# Patient Record
Sex: Female | Born: 1965 | Race: White | Hispanic: No | Marital: Single | Smoking: Former smoker
Health system: Southern US, Community
[De-identification: ages and names within clinical notes are randomized; demographics above are authoritative.]

## PROBLEM LIST (undated history)

## (undated) DIAGNOSIS — L409 Psoriasis, unspecified: Secondary | ICD-10-CM

---

## 1998-08-20 ENCOUNTER — Observation Stay (HOSPITAL_COMMUNITY): Admission: RE | Admit: 1998-08-20 | Discharge: 1998-08-21 | Payer: Self-pay | Admitting: Orthopedic Surgery

## 2000-04-12 ENCOUNTER — Encounter: Payer: Self-pay | Admitting: Neurosurgery

## 2000-04-12 ENCOUNTER — Ambulatory Visit (HOSPITAL_COMMUNITY): Admission: RE | Admit: 2000-04-12 | Discharge: 2000-04-12 | Payer: Self-pay | Admitting: Neurosurgery

## 2000-04-13 ENCOUNTER — Ambulatory Visit (HOSPITAL_COMMUNITY): Admission: RE | Admit: 2000-04-13 | Discharge: 2000-04-13 | Payer: Self-pay | Admitting: Neurosurgery

## 2000-04-13 ENCOUNTER — Encounter: Payer: Self-pay | Admitting: Neurosurgery

## 2003-01-28 ENCOUNTER — Emergency Department (HOSPITAL_COMMUNITY): Admission: EM | Admit: 2003-01-28 | Discharge: 2003-01-28 | Payer: Self-pay | Admitting: Emergency Medicine

## 2003-06-30 ENCOUNTER — Encounter: Payer: Self-pay | Admitting: Dermatology

## 2003-06-30 ENCOUNTER — Ambulatory Visit (HOSPITAL_COMMUNITY): Admission: RE | Admit: 2003-06-30 | Discharge: 2003-06-30 | Payer: Self-pay | Admitting: Dermatology

## 2014-01-07 ENCOUNTER — Emergency Department (HOSPITAL_COMMUNITY)
Admission: EM | Admit: 2014-01-07 | Discharge: 2014-01-07 | Disposition: A | Discharge status: Home / Self Care | Payer: 59 | Source: Home / Self Care | Attending: Family Medicine | Admitting: Family Medicine

## 2014-01-07 ENCOUNTER — Encounter (HOSPITAL_COMMUNITY): Payer: Self-pay | Admitting: Emergency Medicine

## 2014-01-07 DIAGNOSIS — J40 Bronchitis, not specified as acute or chronic: Secondary | ICD-10-CM

## 2014-01-07 HISTORY — DX: Psoriasis, unspecified: L40.9

## 2014-01-07 MED ORDER — IPRATROPIUM-ALBUTEROL 0.5-2.5 (3) MG/3ML IN SOLN: 3.0000 mL | RESPIRATORY_TRACT | Status: DC

## 2014-01-07 MED ORDER — IPRATROPIUM-ALBUTEROL 0.5-2.5 (3) MG/3ML IN SOLN
RESPIRATORY_TRACT | Status: AC
  Filled 2014-01-07: qty 3

## 2014-01-07 MED ORDER — ALBUTEROL SULFATE HFA 108 (90 BASE) MCG/ACT IN AERS: 2.0000 | INHALATION_SPRAY | Freq: Four times a day (QID) | RESPIRATORY_TRACT | Status: AC | PRN

## 2014-01-07 MED ORDER — PREDNISONE 50 MG PO TABS: 50.0000 mg | ORAL_TABLET | Freq: Every day | ORAL | Status: AC

## 2014-01-07 MED ORDER — GUAIFENESIN-CODEINE 100-10 MG/5ML PO SOLN: 5.0000 mL | Freq: Every evening | ORAL | Status: AC | PRN

## 2014-01-07 MED ORDER — IPRATROPIUM-ALBUTEROL 0.5-2.5 (3) MG/3ML IN SOLN
3.0000 mL | Freq: Once | RESPIRATORY_TRACT | Status: AC
  Administered 2014-01-07: 3 mL via RESPIRATORY_TRACT

## 2014-01-07 NOTE — ED Notes (Signed)
C/o productive cough States sx started almost a week ago States sx was a sore throat, dry cough, and stuffy nose States now she has a productive cough and runny nose dayquil has been used as tx

## 2014-01-07 NOTE — Discharge Instructions (Signed)
Thank you for coming in today. Take prednisone daily for 5 days. Use albuterol as needed. Use codeine containing cough medication at bedtime as needed Call or go to the emergency room if you get worse, have trouble breathing, have chest pains, or palpitations.   Bronchitis Bronchitis is inflammation of the airways that extend from the windpipe into the lungs (bronchi). The inflammation often causes mucus to develop, which leads to a cough. If the inflammation becomes severe, it may cause shortness of breath. CAUSES  Bronchitis may be caused by:   Viral infections.   Bacteria.   Cigarette smoke.   Allergens, pollutants, and other irritants.  SIGNS AND SYMPTOMS  The most common symptom of bronchitis is a frequent cough that produces mucus. Other symptoms include:  Fever.   Body aches.   Chest congestion.   Chills.   Shortness of breath.   Sore throat.  DIAGNOSIS  Bronchitis is usually diagnosed through a medical history and physical exam. Tests, such as chest X-rays, are sometimes done to rule out other conditions.  TREATMENT  You may need to avoid contact with whatever caused the problem (smoking, for example). Medicines are sometimes needed. These may include:  Antibiotics. These may be prescribed if the condition is caused by bacteria.  Cough suppressants. These may be prescribed for relief of cough symptoms.   Inhaled medicines. These may be prescribed to help open your airways and make it easier for you to breathe.   Steroid medicines. These may be prescribed for those with recurrent (chronic) bronchitis. HOME CARE INSTRUCTIONS  Get plenty of rest.   Drink enough fluids to keep your urine clear or pale yellow (unless you have a medical condition that requires fluid restriction). Increasing fluids may help thin your secretions and will prevent dehydration.   Only take over-the-counter or prescription medicines as directed by your health care  provider.  Only take antibiotics as directed. Make sure you finish them even if you start to feel better.  Avoid secondhand smoke, irritating chemicals, and strong fumes. These will make bronchitis worse. If you are a smoker, quit smoking. Consider using nicotine gum or skin patches to help control withdrawal symptoms. Quitting smoking will help your lungs heal faster.   Put a cool-mist humidifier in your bedroom at night to moisten the air. This may help loosen mucus. Change the water in the humidifier daily. You can also run the hot water in your shower and sit in the bathroom with the door closed for 5 10 minutes.   Follow up with your health care provider as directed.   Wash your hands frequently to avoid catching bronchitis again or spreading an infection to others.  SEEK MEDICAL CARE IF: Your symptoms do not improve after 1 week of treatment.  SEEK IMMEDIATE MEDICAL CARE IF:  Your fever increases.  You have chills.   You have chest pain.   You have worsening shortness of breath.   You have bloody sputum.  You faint.  You have lightheadedness.  You have a severe headache.   You vomit repeatedly. MAKE SURE YOU:   Understand these instructions.  Will watch your condition.  Will get help right away if you are not doing well or get worse. Document Released: 11/27/2005 Document Revised: 09/17/2013 Document Reviewed: 07/22/2013 [facility] Patient Information 2014 [place], [place].

## 2014-01-07 NOTE — ED Provider Notes (Signed)
Tina BerryMargaret A Roman is a 48 y.o. female who presents to Urgent Care today for 6 days of productive cough mild shortness of breath and chills. No nausea vomiting diarrhea or fever. Patient has tried DayQuil which seems to help. She denies any chest pains or palpitations. The cough is quite bothersome especially at bedtime.   Past Medical History  Diagnosis Date  . Psoriasis    History  Substance Use Topics  . Smoking status: Former Games developermoker  . Smokeless tobacco: Not on file  . Alcohol Use: Not on file   ROS as above Medications: No current facility-administered medications for this encounter.   Current Outpatient Prescriptions  Medication Sig Dispense Refill  . albuterol (PROVENTIL HFA;VENTOLIN HFA) 108 (90 BASE) MCG/ACT inhaler Inhale 2 puffs into the lungs every 6 (six) hours as needed for wheezing or shortness of breath.  1 Inhaler  2  . guaiFENesin-codeine 100-10 MG/5ML syrup Take 5 mLs by mouth at bedtime as needed for cough.  120 mL  0  . predniSONE (DELTASONE) 50 MG tablet Take 1 tablet (50 mg total) by mouth daily.  5 tablet  0   patient uses Enbrel intermittently for psoriasis  Exam:  BP 125/84  Pulse 99  Temp(Src) 98.4 F (36.9 C) (Oral)  Resp 18  SpO2 98%  LMP 01/05/2014 Gen: Well NAD HEENT: EOMI,  MMM posterior pharynx with cobblestoning. Tympanic membranes are normal appearing bilaterally Lungs: Normal work of breathing. Coarse breath sounds bilaterally. Heart: RRR no MRG Abd: NABS, Soft. NT, ND Exts: Brisk capillary refill, warm and well perfused.   Patient was given a DuoNeb nebulizer treatment and had significant improvement in symptoms.  Assessment and Plan: 48 y.o. female with bronchitis likely due to viral cause with possible underlying COPD. Plan to treat with prednisone, albuterol, codeine containing cough medication. Work note provided.  Discussed warning signs or symptoms. Please see discharge instructions. Patient expresses understanding.    Rodolph BongEvan S  Zerah Hilyer, MD 01/07/14 [phone]

## 2018-11-19 ENCOUNTER — Other Ambulatory Visit: Payer: Self-pay | Admitting: Physician Assistant

## 2018-11-19 DIAGNOSIS — R7989 Other specified abnormal findings of blood chemistry: Secondary | ICD-10-CM

## 2018-11-19 DIAGNOSIS — R945 Abnormal results of liver function studies: Secondary | ICD-10-CM

## 2018-11-26 ENCOUNTER — Ambulatory Visit
Admission: RE | Admit: 2018-11-26 | Discharge: 2018-11-26 | Disposition: A | Discharge status: Home / Self Care | Payer: 59 | Source: Ambulatory Visit | Attending: Physician Assistant | Admitting: Physician Assistant

## 2018-11-26 DIAGNOSIS — R945 Abnormal results of liver function studies: Secondary | ICD-10-CM

## 2018-11-26 DIAGNOSIS — R7989 Other specified abnormal findings of blood chemistry: Secondary | ICD-10-CM

## 2020-02-07 ENCOUNTER — Ambulatory Visit: Payer: BC Managed Care – PPO | Attending: Internal Medicine

## 2020-02-07 DIAGNOSIS — Z23 Encounter for immunization: Secondary | ICD-10-CM | POA: Insufficient documentation

## 2020-02-07 NOTE — Progress Notes (Signed)
   Covid-19 Vaccination Clinic  Name:  Tina Roman    MRN: [id number] DOB: 07-04-1966  02/07/2020  Tina Roman was observed post Covid-19 immunization for 15 minutes without incidence. She was provided with Vaccine Information Sheet and instruction to access the V-Safe system.   Tina Roman was instructed to call 911 with any severe reactions post vaccine: Marland Kitchen Difficulty breathing  . Swelling of your face and throat  . A fast heartbeat  . A bad rash all over your body  . Dizziness and weakness    Immunizations Administered    Name Date Dose VIS Date Route   Pfizer COVID-19 Vaccine 02/07/2020 12:31 PM 0.3 mL 11/21/2019 Intramuscular   Manufacturer: [employer], [employer]   Lot: [id number]   NDC: [address]-4

## 2020-02-28 ENCOUNTER — Ambulatory Visit: Payer: BC Managed Care – PPO | Attending: Internal Medicine

## 2020-02-28 DIAGNOSIS — Z23 Encounter for immunization: Secondary | ICD-10-CM

## 2020-02-28 NOTE — Progress Notes (Signed)
   Covid-19 Vaccination Clinic  Name:  Shayma Pfefferle Diveley    MRN: [id number] DOB: Nov 05, 1966  02/28/2020  Ms. Kadrmas was observed post Covid-19 immunization for 15 minutes without incident. She was provided with Vaccine Information Sheet and instruction to access the V-Safe system.   Ms. Prindle was instructed to call 911 with any severe reactions post vaccine: Marland Kitchen Difficulty breathing  . Swelling of face and throat  . A fast heartbeat  . A bad rash all over body  . Dizziness and weakness   Immunizations Administered    Name Date Dose VIS Date Route   Pfizer COVID-19 Vaccine 02/28/2020 11:36 AM 0.3 mL 11/21/2019 Intramuscular   Manufacturer: [employer], [employer]   Lot: [id number]   NDC: [address]-5

## 2020-03-03 ENCOUNTER — Ambulatory Visit: Payer: BC Managed Care – PPO

## 2021-07-27 ENCOUNTER — Other Ambulatory Visit: Payer: Self-pay | Admitting: Physician Assistant

## 2021-07-27 DIAGNOSIS — M5442 Lumbago with sciatica, left side: Secondary | ICD-10-CM

## 2021-08-08 ENCOUNTER — Other Ambulatory Visit: Payer: Self-pay | Admitting: Physician Assistant

## 2021-08-08 DIAGNOSIS — Z1231 Encounter for screening mammogram for malignant neoplasm of breast: Secondary | ICD-10-CM

## 2021-09-19 ENCOUNTER — Ambulatory Visit
Admission: RE | Admit: 2021-09-19 | Discharge: 2021-09-19 | Disposition: A | Discharge status: Home / Self Care | Payer: BC Managed Care – PPO | Source: Ambulatory Visit | Attending: Physician Assistant | Admitting: Physician Assistant

## 2021-09-19 ENCOUNTER — Other Ambulatory Visit: Payer: Self-pay

## 2021-09-19 DIAGNOSIS — Z1231 Encounter for screening mammogram for malignant neoplasm of breast: Secondary | ICD-10-CM

## 2021-09-22 ENCOUNTER — Other Ambulatory Visit: Payer: Self-pay | Admitting: Physician Assistant

## 2021-09-22 DIAGNOSIS — R928 Other abnormal and inconclusive findings on diagnostic imaging of breast: Secondary | ICD-10-CM

## 2021-10-14 ENCOUNTER — Other Ambulatory Visit: Payer: Self-pay

## 2021-10-14 ENCOUNTER — Ambulatory Visit
Admission: RE | Admit: 2021-10-14 | Discharge: 2021-10-14 | Disposition: A | Discharge status: Home / Self Care | Payer: BC Managed Care – PPO | Source: Ambulatory Visit | Attending: Physician Assistant | Admitting: Physician Assistant

## 2021-10-14 ENCOUNTER — Other Ambulatory Visit: Payer: Self-pay | Admitting: Physician Assistant

## 2021-10-14 DIAGNOSIS — R599 Enlarged lymph nodes, unspecified: Secondary | ICD-10-CM

## 2021-10-14 DIAGNOSIS — R928 Other abnormal and inconclusive findings on diagnostic imaging of breast: Secondary | ICD-10-CM

## 2022-01-16 ENCOUNTER — Ambulatory Visit
Admission: RE | Admit: 2022-01-16 | Discharge: 2022-01-16 | Disposition: A | Discharge status: Home / Self Care | Payer: BC Managed Care – PPO | Source: Ambulatory Visit | Attending: Physician Assistant | Admitting: Physician Assistant

## 2022-01-16 ENCOUNTER — Other Ambulatory Visit: Payer: Self-pay

## 2022-01-16 DIAGNOSIS — R599 Enlarged lymph nodes, unspecified: Secondary | ICD-10-CM

## 2022-03-19 IMAGING — MG MM DIGITAL SCREENING BILAT W/ TOMO AND CAD
8 series · 8 of 24 positions shown · non-contrast
Comparison: None.

CLINICAL DATA: Screening.

EXAM:
DIGITAL SCREENING BILATERAL MAMMOGRAM WITH TOMOSYNTHESIS AND CAD
TECHNIQUE: Bilateral screening digital craniocaudal and mediolateral oblique
mammograms were obtained. Bilateral screening digital breast
tomosynthesis was performed. The images were evaluated with
computer-aided detection.

[L MLO synth-2D]
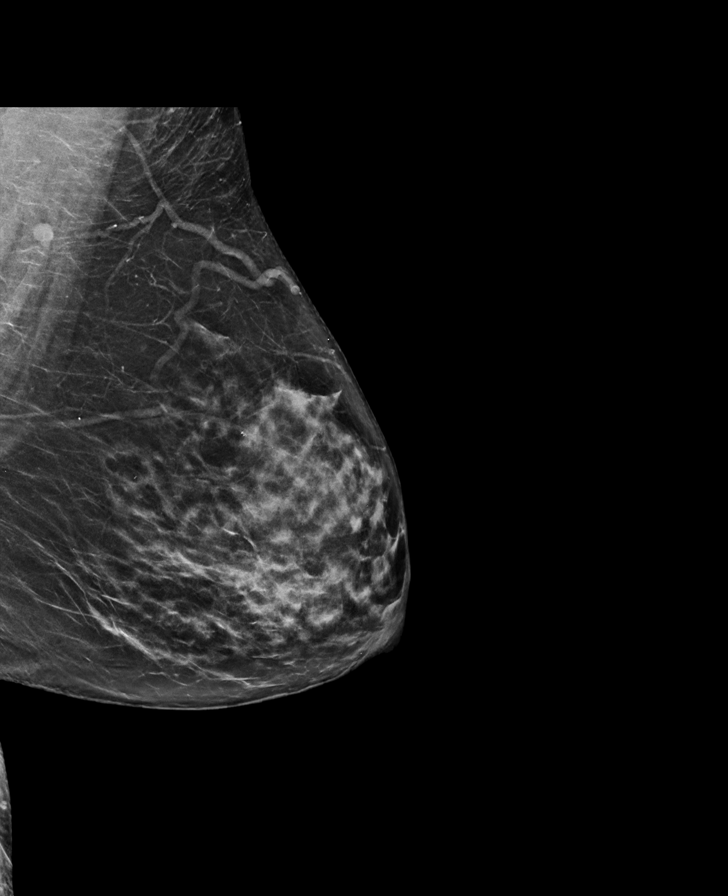

[R MLO synth-2D]
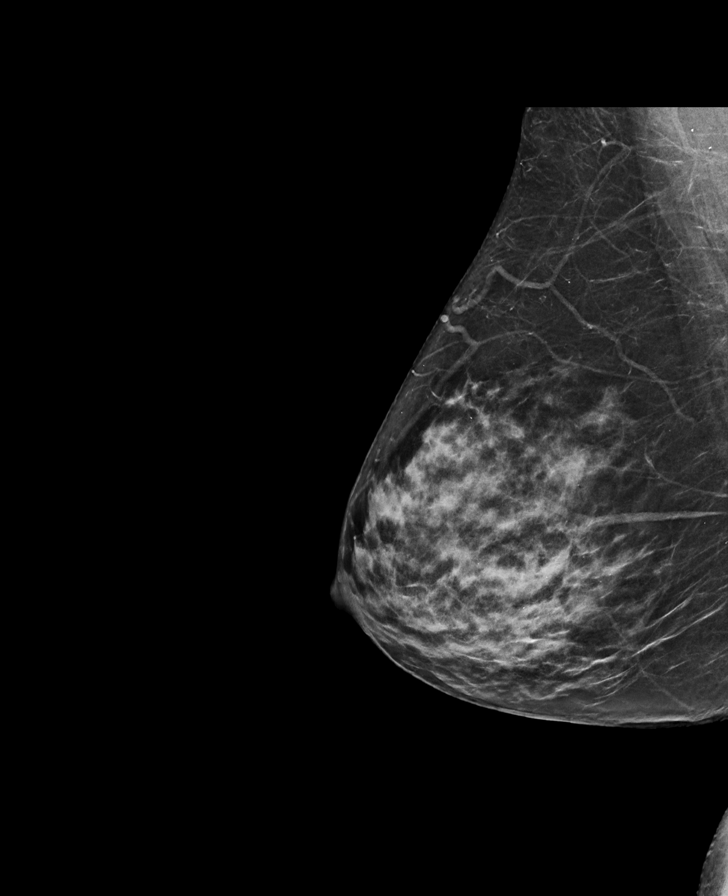

[R CC synth-2D]
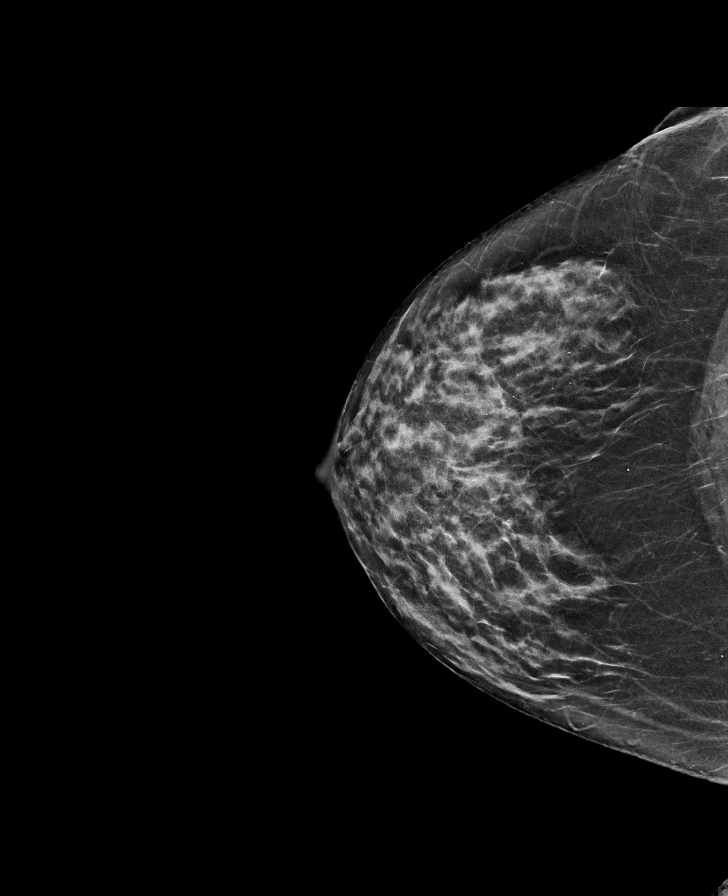

[L CC synth-2D]
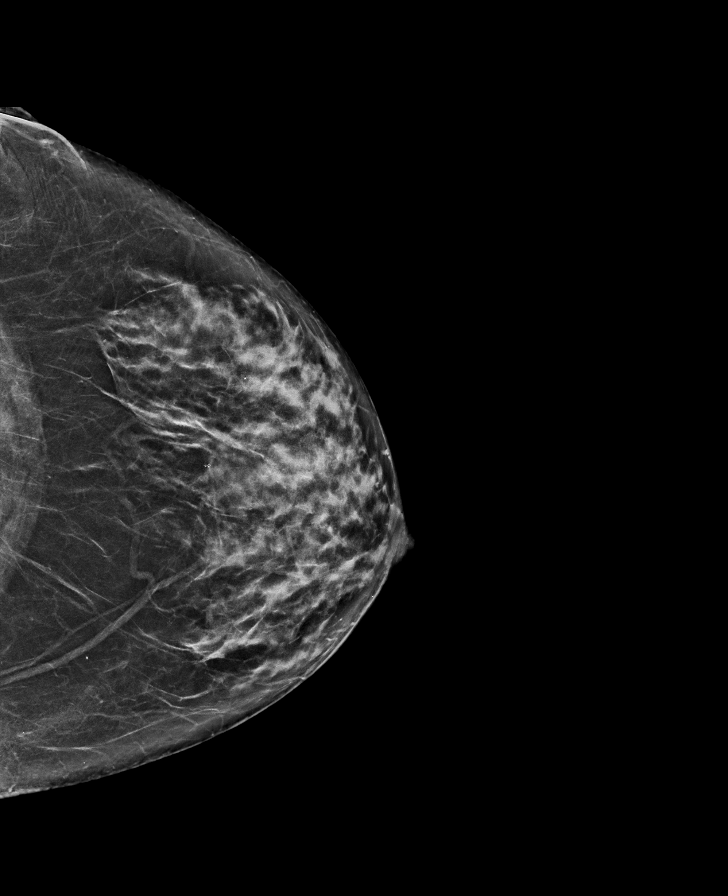

[R CC tomo · tomo slice 33/64.0]
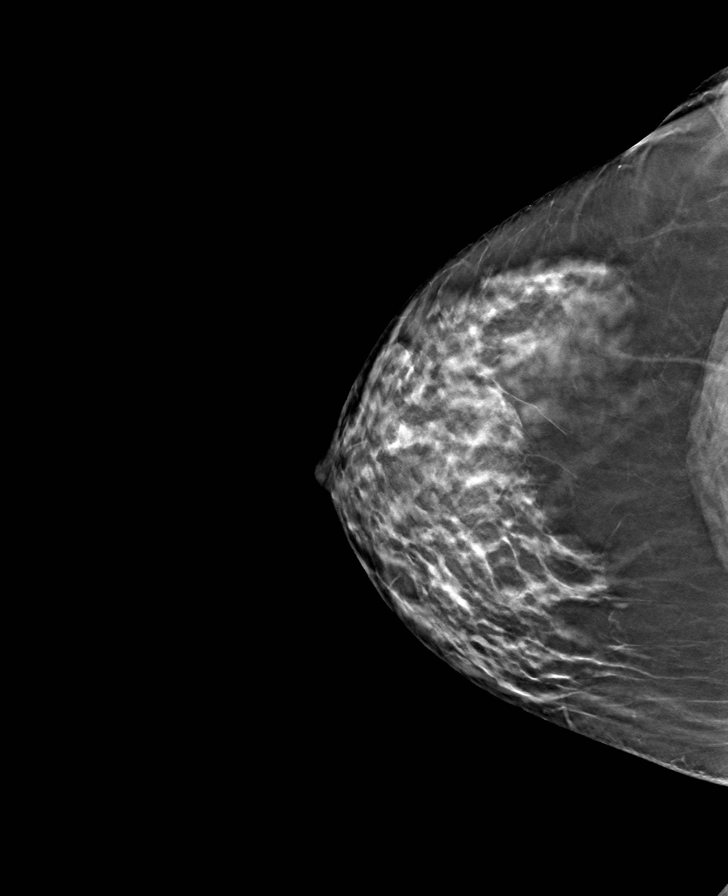

[R MLO tomo · tomo slice 33/65.0]
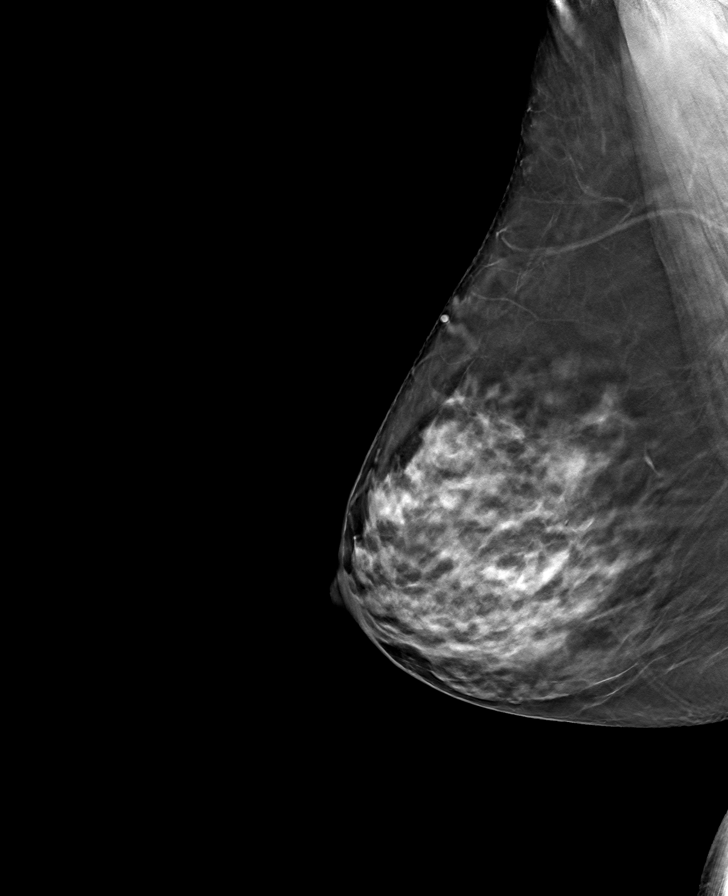

[L MLO tomo · tomo slice 33/66.0]
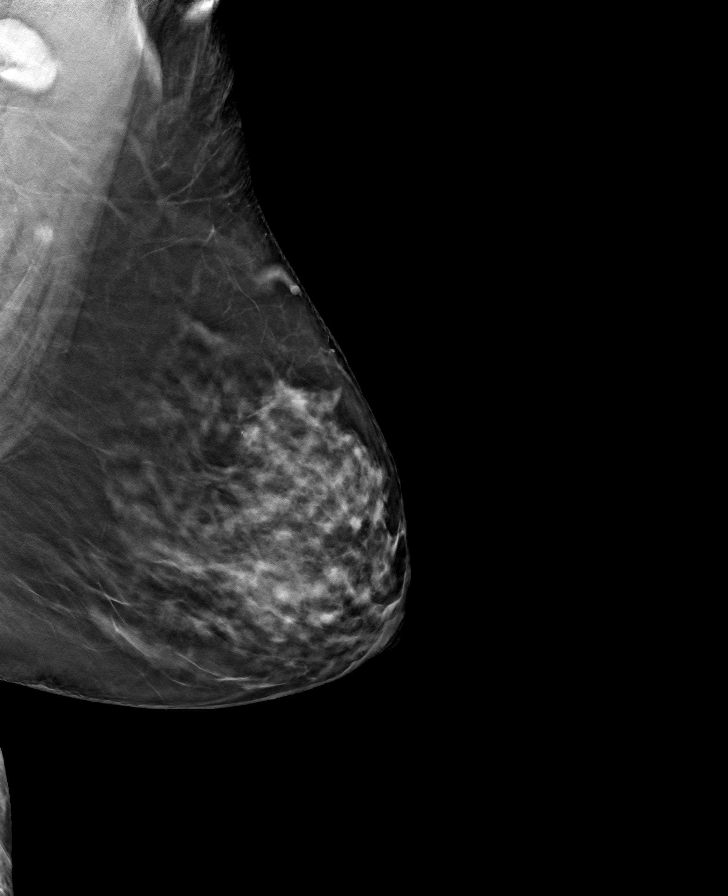

[L CC tomo · tomo slice 32/63.0]
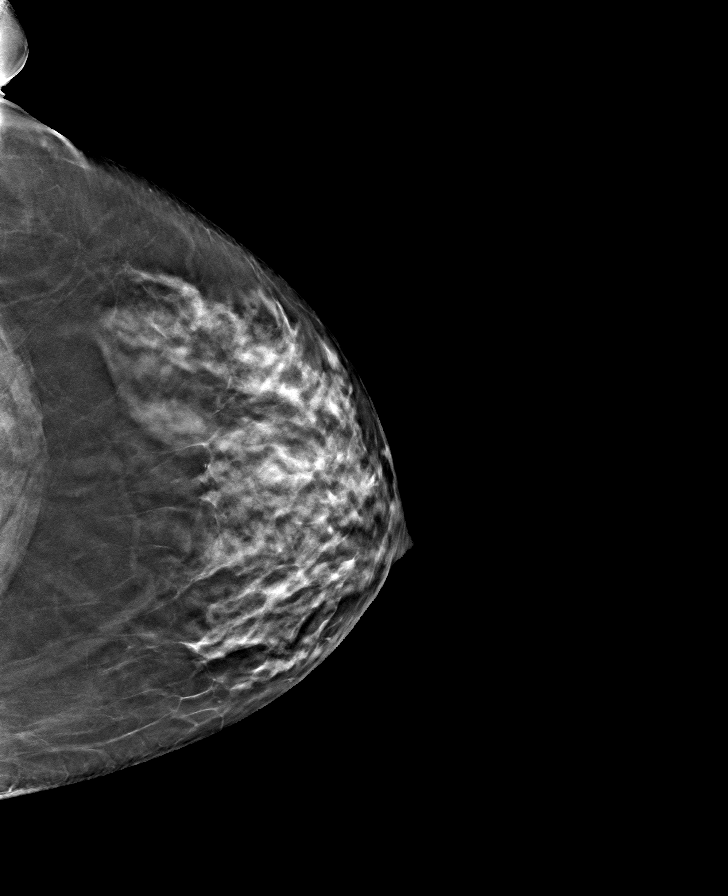

[8 of 24 positions shown; findings below may reference images not displayed]

ACR Breast Density Category c: The breast tissue is heterogeneously
dense, which may obscure small masses.
FINDINGS: In the left axilla, a possible mass warrants further evaluation. In
the right breast, no findings suspicious for malignancy.
IMPRESSION: Further evaluation is suggested for possible mass in the left
axilla.

RECOMMENDATION:
Ultrasound of the left axilla. (Code:[id number])

The patient will be contacted regarding the findings, and additional
imaging will be scheduled.

BI-RADS CATEGORY  0: Incomplete. Need additional imaging evaluation
and/or prior mammograms for comparison.

## 2022-04-13 IMAGING — US US AXILLARY LEFT
1 series · 5 of 5 positions shown · non-contrast
Comparison: No prior ultrasound.  Screening mammogram 09/19/2021.

CLINICAL DATA: Recall from screening mammography, possible enlarged
LEFT axillary lymph node.

EXAM:
ULTRASOUND OF THE LEFT AXILLA

[Series 1: us axillary left · 0.07mm/px · 5 of 5 slices shown]
[im 1/5]
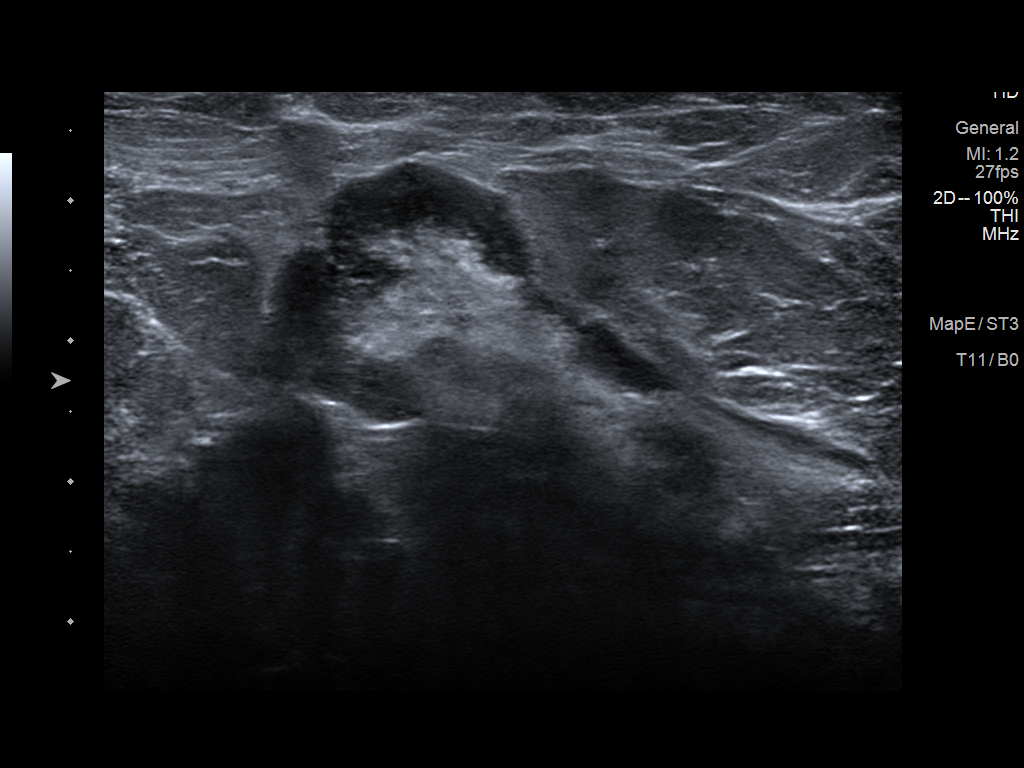
[im 2/5]
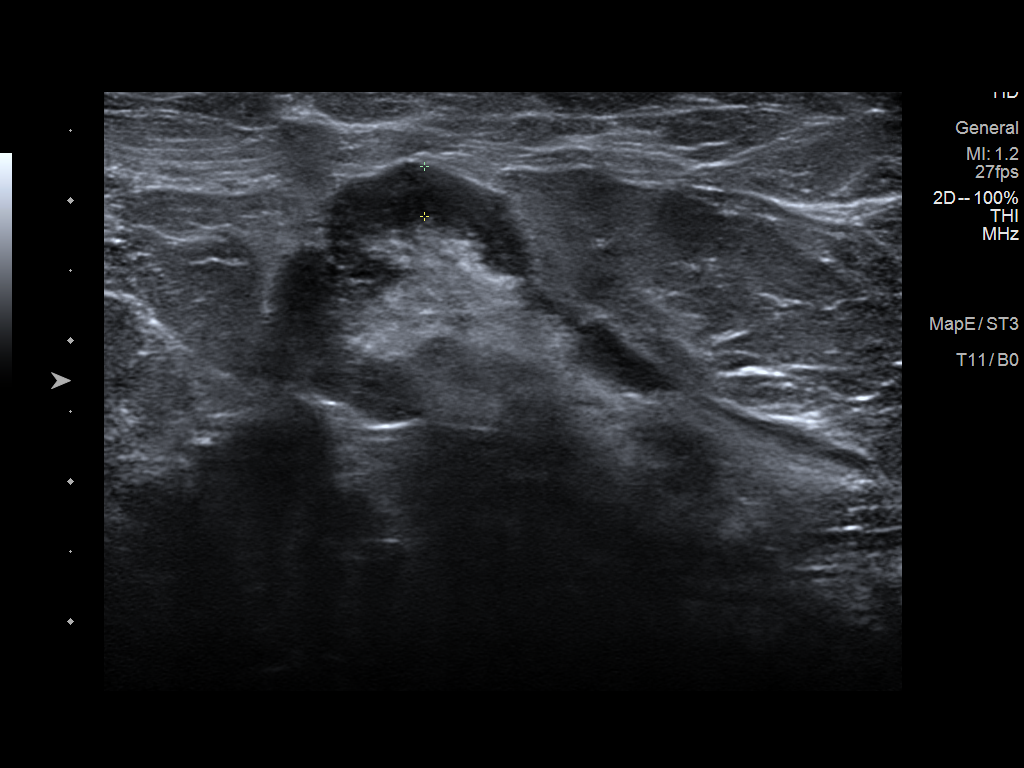
[im 3/5]
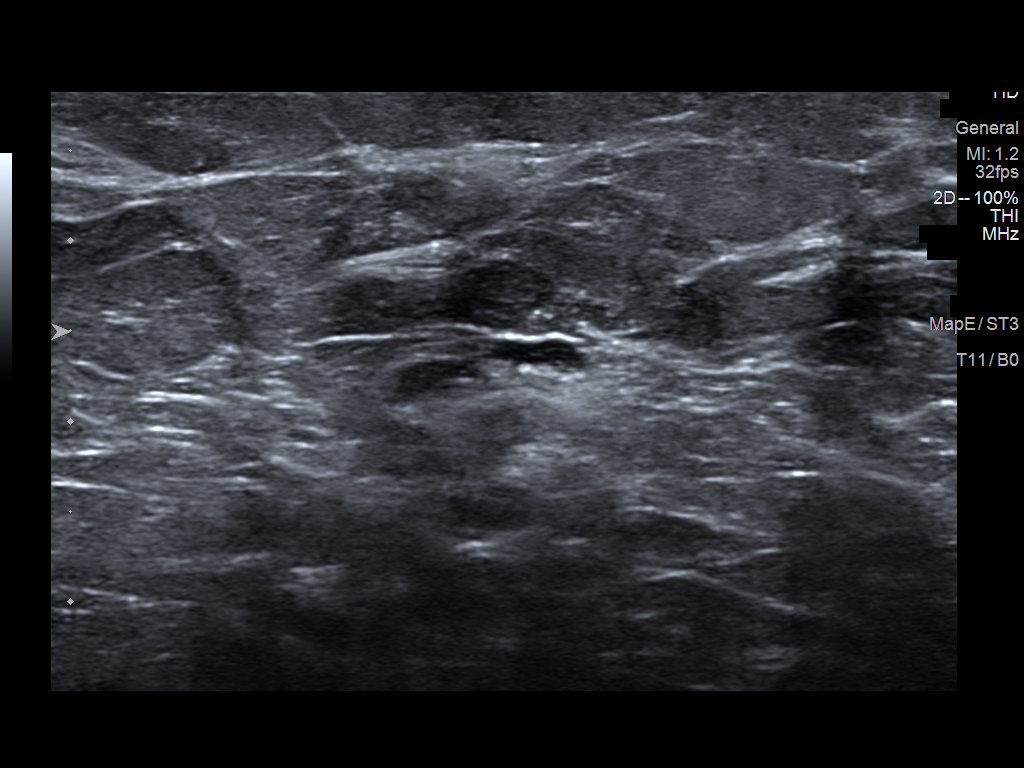
[im 4/5]
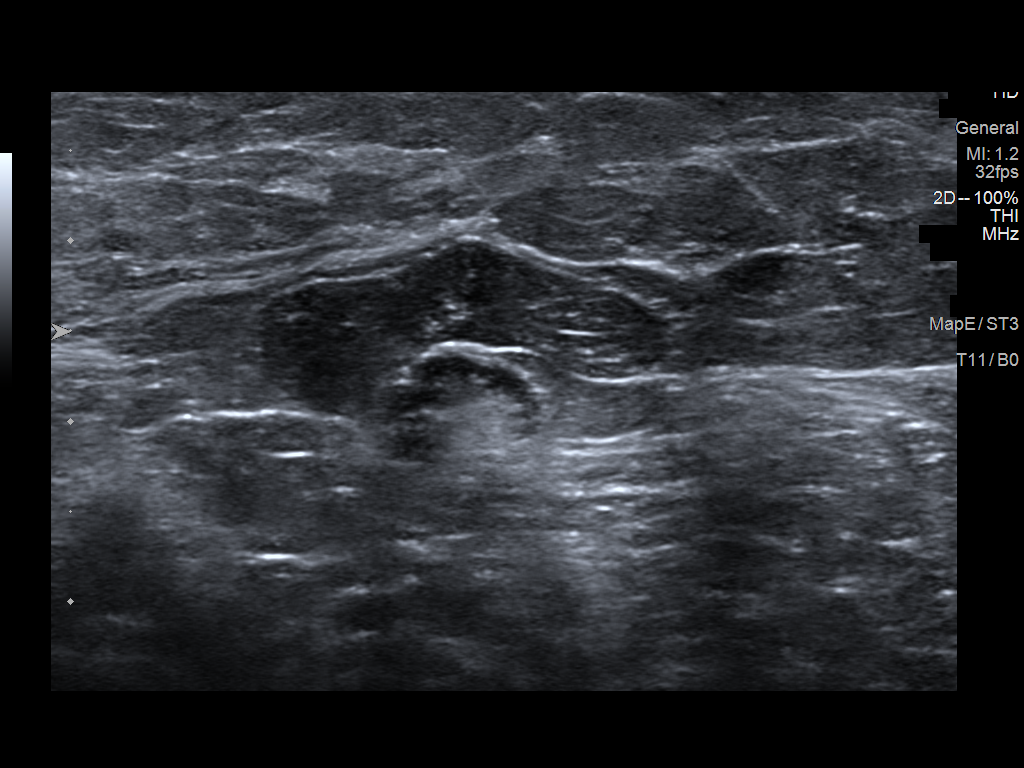
[im 5/5]
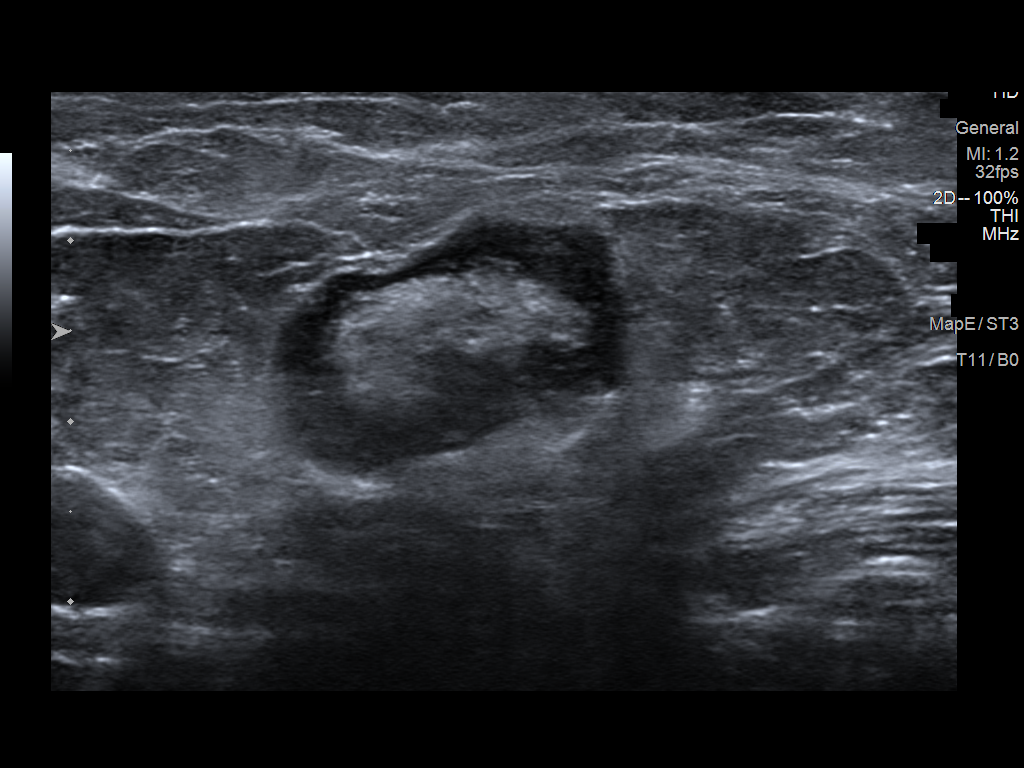

[5 of 5 positions shown; findings below may reference images not displayed]

FINDINGS: Ultrasound is performed, showing a lymph node measuring
approximately 2.8 cm in length with normal morphology including a
normal fatty hilus, demonstrating borderline cortical thickening up
to 4 mm. No morphologically abnormal lymph nodes are identified.
IMPRESSION: Likely benign reactive LEFT axillary lymph node with borderline
cortical thickening up to 4 mm.

RECOMMENDATION:
LEFT axillary ultrasound in 3 months to confirm stability of the
likely benign lymph node.

I have discussed the findings and recommendations with the patient.
If applicable, a reminder letter will be sent to the patient
regarding the next appointment.

Patient states that she is scheduled for her next [id number] booster
vaccination next week. I counseled her to have the vaccine in the
RIGHT arm so as not to interfere with the follow-up examination of
the LEFT axilla.

BI-RADS CATEGORY  3: Probably benign.

## 2022-07-16 IMAGING — US US AXILLARY LEFT
1 series · 9 of 9 positions shown · non-contrast
Comparison: Previous exam(s).

CLINICAL DATA: 55-year-old female presenting for 3 month follow-up
of a probably benign left axillary lymph node.

EXAM:
ULTRASOUND OF THE LEFT AXILLA

[Series 1: us axillary left · 0.06mm/px · 9 of 9 slices shown]
[im 1/9]
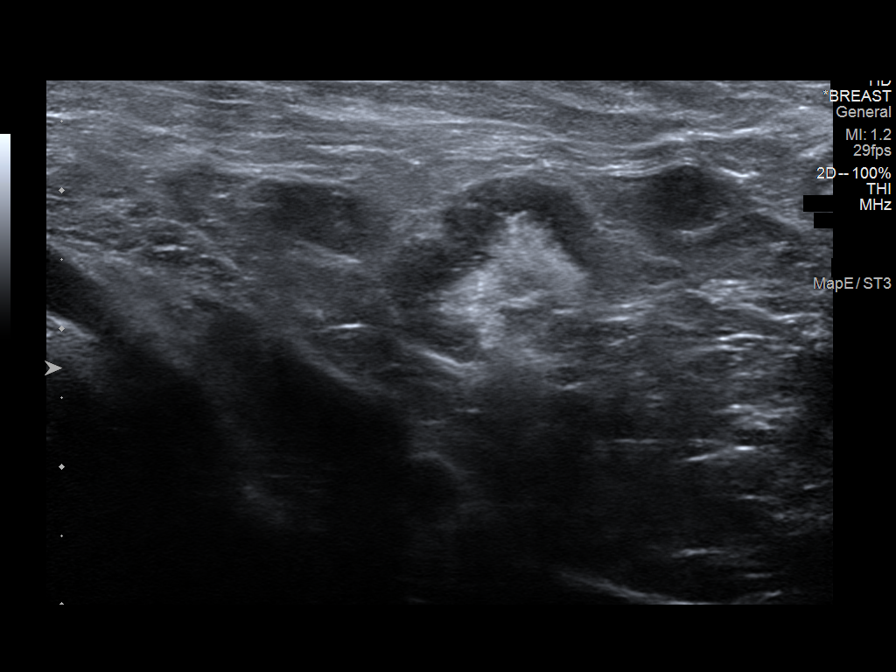
[im 2/9]
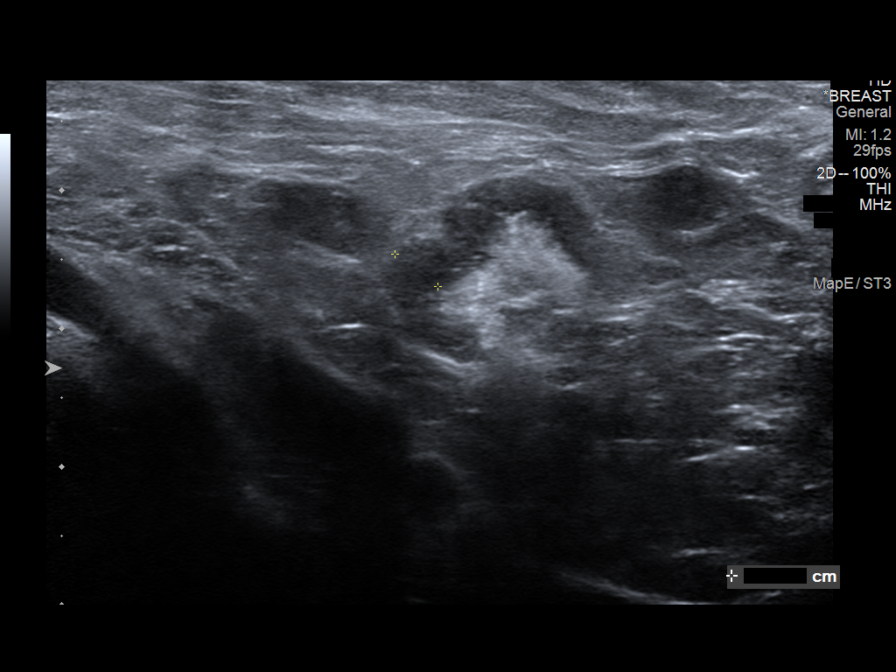
[im 3/9]
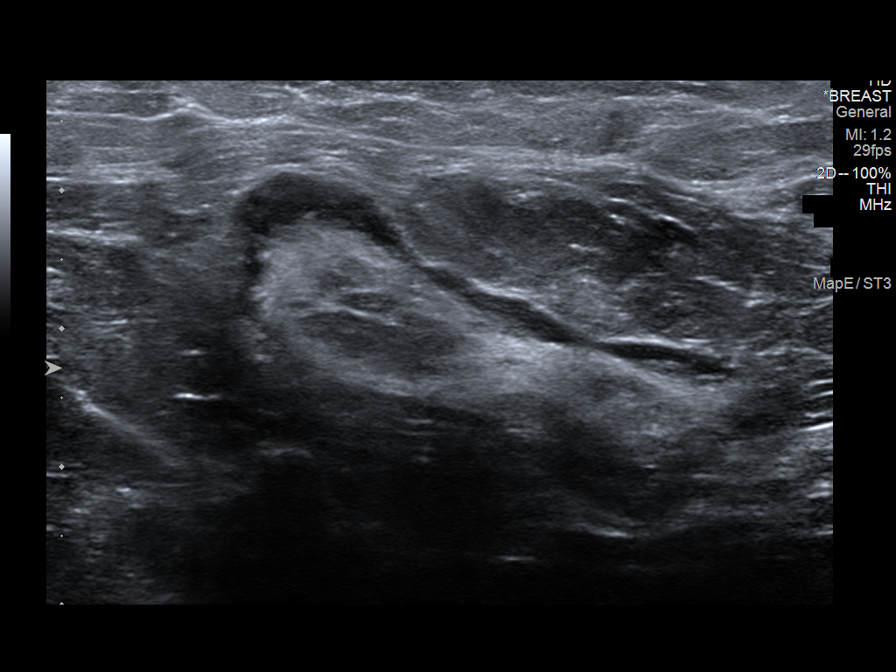
[im 4/9]
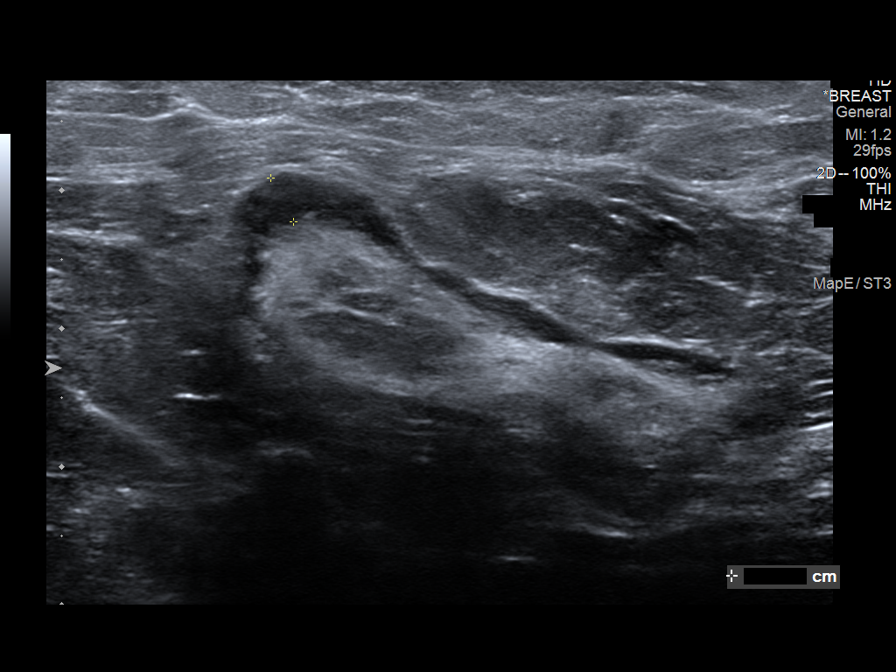
[im 5/9]
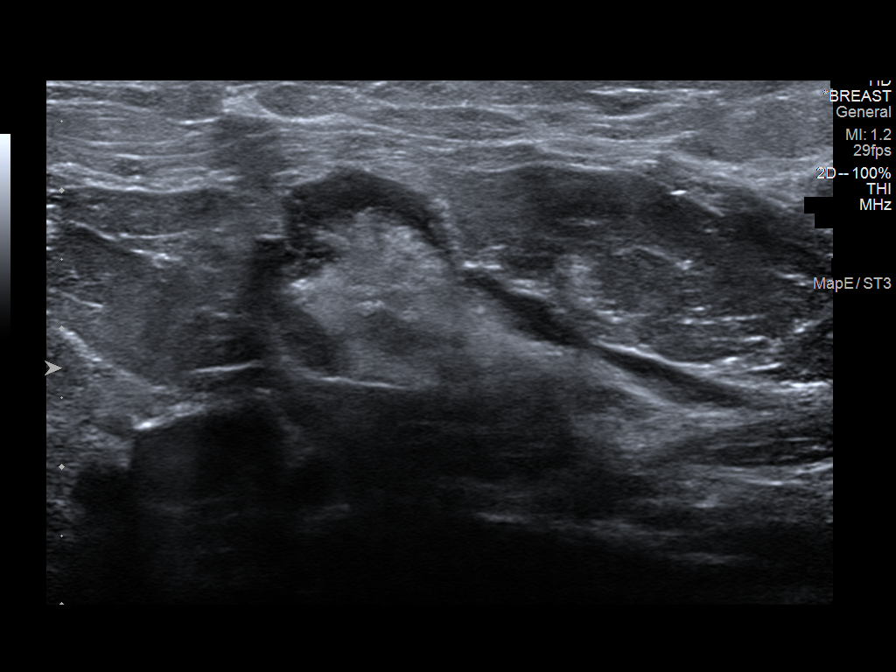
[im 6/9]
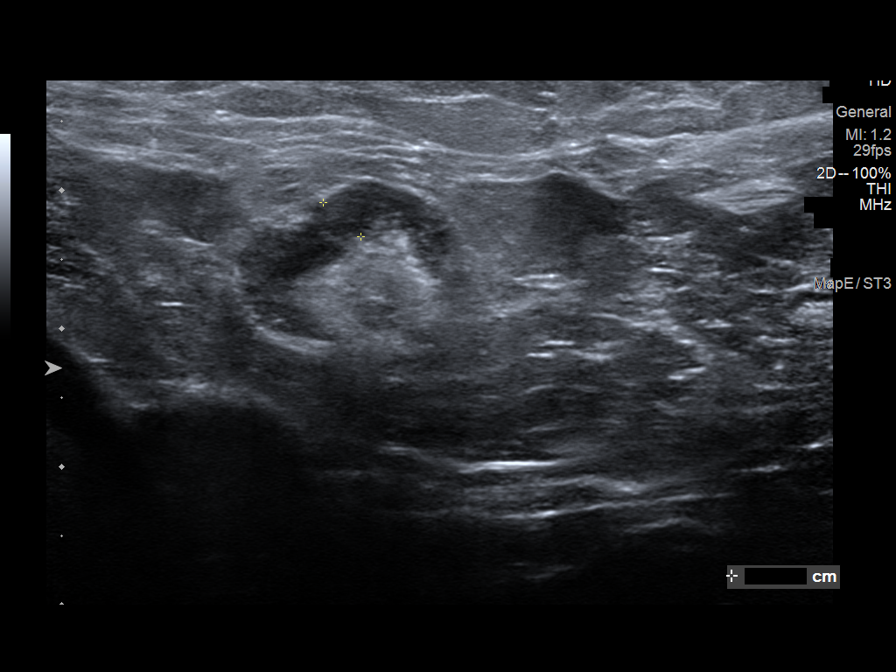
[im 7/9]
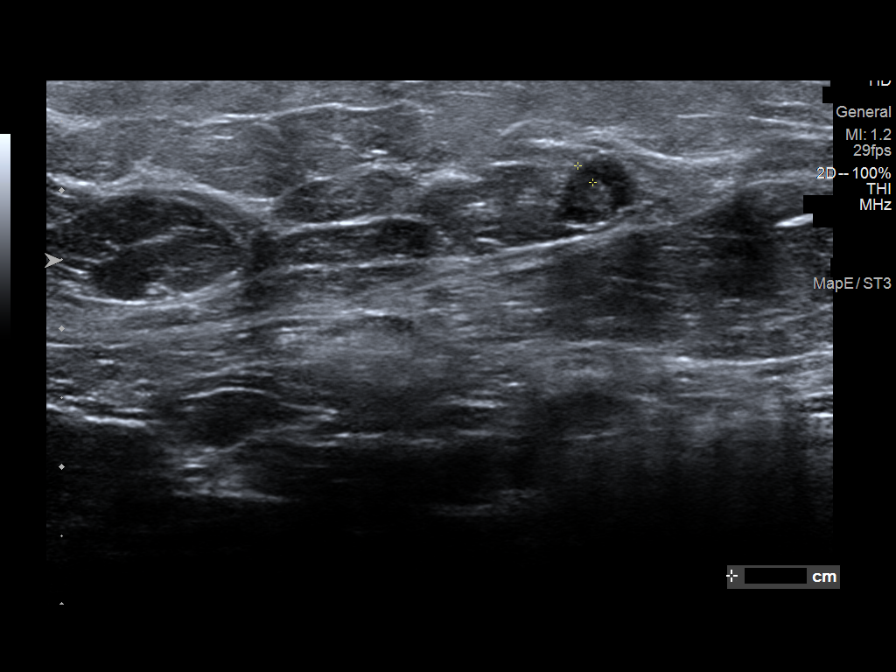
[im 8/9]
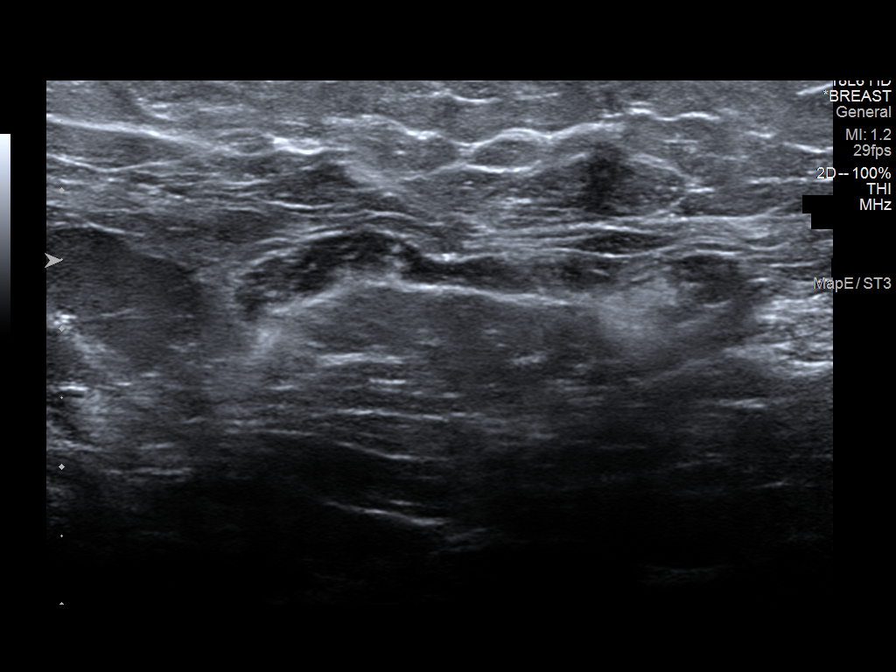
[im 9/9]
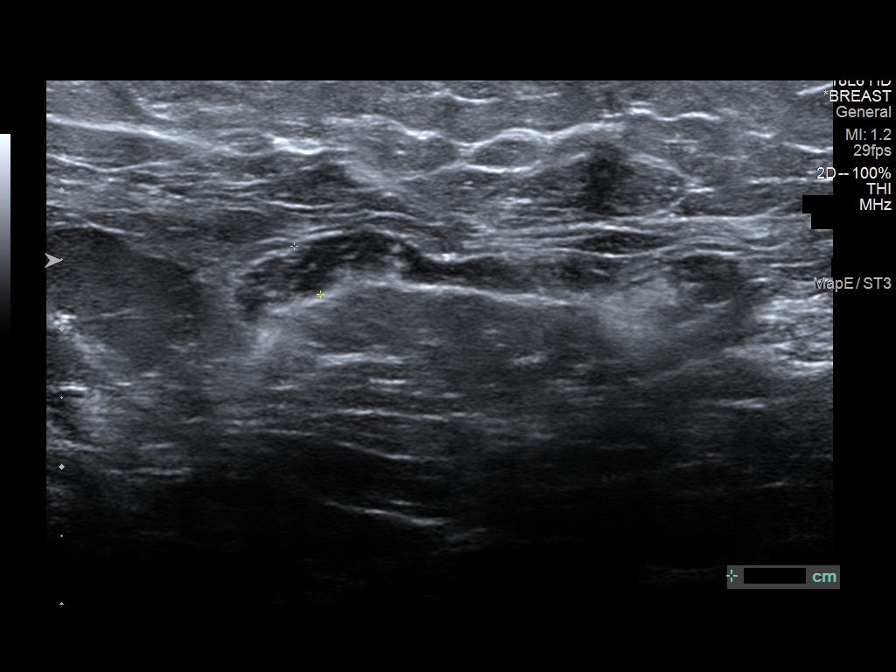

[9 of 9 positions shown; findings below may reference images not displayed]

FINDINGS: Ultrasound is performed in the left axilla demonstrates a lymph node
with normal morphology and retained fatty hilum measuring up to 4 mm
in cortical thickness. This is stable compared to the prior exam.
For comparison purposes the right axilla was briefly scanned
demonstrating a similar appearing lymph node. There is no suspicious
lymph node or mass in the left axilla.
IMPRESSION: Benign left axillary lymph nodes.

RECOMMENDATION:
Return to routine annual screening mammography which will be due in
September 2022.

I have discussed the findings and recommendations with the patient.
If applicable, a reminder letter will be sent to the patient
regarding the next appointment.

BI-RADS CATEGORY  2: Benign.
